# Patient Record
Sex: Male | Born: 1995 | Race: Black or African American | Hispanic: No | Marital: Single | Smoking: Never smoker
Health system: Southern US, Community
[De-identification: ages and names within clinical notes are randomized; demographics above are authoritative.]

## PROBLEM LIST (undated history)

## (undated) HISTORY — PX: CHOLECYSTECTOMY: SHX55

---
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
## 2018-02-23 ENCOUNTER — Encounter (HOSPITAL_COMMUNITY): Payer: Self-pay

## 2018-02-23 ENCOUNTER — Ambulatory Visit (HOSPITAL_COMMUNITY)
Admission: EM | Admit: 2018-02-23 | Discharge: 2018-02-23 | Disposition: A | Discharge status: Home / Self Care | Payer: Medicaid Other | Attending: Internal Medicine | Admitting: Internal Medicine

## 2018-02-23 DIAGNOSIS — Z113 Encounter for screening for infections with a predominantly sexual mode of transmission: Secondary | ICD-10-CM | POA: Diagnosis not present

## 2018-02-23 DIAGNOSIS — Z9049 Acquired absence of other specified parts of digestive tract: Secondary | ICD-10-CM | POA: Insufficient documentation

## 2018-02-23 DIAGNOSIS — R300 Dysuria: Secondary | ICD-10-CM | POA: Diagnosis present

## 2018-02-23 DIAGNOSIS — Z202 Contact with and (suspected) exposure to infections with a predominantly sexual mode of transmission: Secondary | ICD-10-CM | POA: Insufficient documentation

## 2018-02-23 DIAGNOSIS — Z7251 High risk heterosexual behavior: Secondary | ICD-10-CM

## 2018-02-23 LAB — POCT URINALYSIS DIP (DEVICE)
BILIRUBIN URINE: NEGATIVE
Glucose, UA: NEGATIVE mg/dL
HGB URINE DIPSTICK: NEGATIVE
Ketones, ur: NEGATIVE mg/dL
LEUKOCYTES UA: NEGATIVE
Nitrite: NEGATIVE
Protein, ur: 30 mg/dL — AB
SPECIFIC GRAVITY, URINE: 1.025 (ref 1.005–1.030)
Urobilinogen, UA: 0.2 mg/dL (ref 0.0–1.0)
pH: 6.5 (ref 5.0–8.0)

## 2018-02-23 MED ORDER — AZITHROMYCIN 250 MG PO TABS
ORAL_TABLET | ORAL | Status: AC
  Filled 2018-02-23: qty 4

## 2018-02-23 MED ORDER — AZITHROMYCIN 250 MG PO TABS
1000.0000 mg | ORAL_TABLET | Freq: Once | ORAL | Status: AC
  Administered 2018-02-23: 1000 mg via ORAL

## 2018-02-23 MED ORDER — CEFTRIAXONE SODIUM 250 MG IJ SOLR
250.0000 mg | Freq: Once | INTRAMUSCULAR | Status: AC
  Administered 2018-02-23: 250 mg via INTRAMUSCULAR

## 2018-02-23 MED ORDER — CEFTRIAXONE SODIUM 250 MG IJ SOLR
INTRAMUSCULAR | Status: AC
  Filled 2018-02-23: qty 250

## 2018-02-23 NOTE — ED Triage Notes (Signed)
Pt presents for STD testing after exposure

## 2018-02-23 NOTE — ED Provider Notes (Signed)
[facility]   [id number] 02/23/18 Arrival Time: 1756   SUBJECTIVE:  Johnny Rowe is a 22 y.o. male who presents with complaints of intermittent dysuria x 1 week.  Last unprotected sex 2 weeks ago.  Patient is sexually active with 2 male partners.  Partner reports having symptoms and diagnosed with chlamydia.  Denies aggravating or alleviating factors. Denies similar symptoms in the past.  Denies fever, chills, nausea, vomiting, abdominal pain, penile pain, penile itching, testicular swelling, testicular pain, penile rashes or lesions.   No LMP for male patient.  ROS: As per HPI.  History reviewed. No pertinent past medical history. Past Surgical History:  Procedure Laterality Date  . CHOLECYSTECTOMY     Not on File No current facility-administered medications on file prior to encounter.    No current outpatient medications on file prior to encounter.    Social History   Socioeconomic History  . Marital status: Single    Spouse name: Not on file  . Number of children: Not on file  . Years of education: Not on file  . Highest education level: Not on file  Occupational History  . Not on file  Social Needs  . Financial resource strain: Not on file  . Food insecurity:    Worry: Not on file    Inability: Not on file  . Transportation needs:    Medical: Not on file    Non-medical: Not on file  Tobacco Use  . Smoking status: Never Smoker  Substance and Sexual Activity  . Alcohol use: Yes  . Drug use: Not Currently  . Sexual activity: Not on file  Lifestyle  . Physical activity:    Days per week: Not on file    Minutes per session: Not on file  . Stress: Not on file  Relationships  . Social connections:    Talks on phone: Not on file    Gets together: Not on file    Attends religious service: Not on file    Active member of club or organization: Not on file    Attends meetings of clubs or organizations: Not on file    Relationship status: Not on file  .  Intimate partner violence:    Fear of current or ex partner: Not on file    Emotionally abused: Not on file    Physically abused: Not on file    Forced sexual activity: Not on file  Other Topics Concern  . Not on file  Social History Narrative  . Not on file   Family History  Problem Relation Age of Onset  . Healthy Mother   . Healthy Father     OBJECTIVE:  Vitals:   02/23/18 1809  BP: 131/73  Pulse: 70  Resp: 16  SpO2: 100%     General appearance: alert, cooperative, appears stated age and no distress Throat: lips, mucosa, and tongue normal; teeth and gums normal Lungs: CTA bilaterally without adventitious breath sounds Heart: regular rate and rhythm.  Radial pulses 2+ symmetrical bilaterally Back: no CVA tenderness Abdomen: soft, non-tender; bowel sounds normal; no masses or organomegaly; no guarding or rebound tenderness GU: declines Skin: warm and dry Psychological:  Alert and cooperative. Normal mood and affect.  Results for orders placed or performed during the hospital encounter of 02/23/18  POCT urinalysis dip (device)  Result Value Ref Range   Glucose, UA NEGATIVE NEGATIVE mg/dL   Bilirubin Urine NEGATIVE NEGATIVE   Ketones, ur NEGATIVE NEGATIVE mg/dL   Specific Gravity, Urine  1.025 1.005 - 1.030   Hgb urine dipstick NEGATIVE NEGATIVE   pH 6.5 5.0 - 8.0   Protein, ur 30 (A) NEGATIVE mg/dL   Urobilinogen, UA 0.2 0.0 - 1.0 mg/dL   Nitrite NEGATIVE NEGATIVE   Leukocytes, UA NEGATIVE NEGATIVE    Labs Reviewed  POCT URINALYSIS DIP (DEVICE) - Abnormal; Notable for the following components:      Result Value   Protein, ur 30 (*)    All other components within normal limits  HIV ANTIBODY (ROUTINE TESTING)  [facility]  URINE CYTOLOGY ANCILLARY ONLY    ASSESSMENT & PLAN:  1. Dysuria   2. STD exposure   3. Unprotected sex     Meds ordered this encounter  Medications  . azithromycin (ZITHROMAX) tablet 1,000 mg  . cefTRIAXone (ROCEPHIN) injection 250 mg     Pending: Labs Reviewed  POCT URINALYSIS DIP (DEVICE) - Abnormal; Notable for the following components:      Result Value   Protein, ur 30 (*)    All other components within normal limits  HIV ANTIBODY (ROUTINE TESTING)  [facility]  URINE CYTOLOGY ANCILLARY ONLY   Urine did show protein in your urine.  This could be due to dehydration.  Please increase your water intake and follow up with PCP or community health in 1-2 weeks to have urine rechecked.   Given rocephin 250mg  injection and azithromycin 1g in office Urine cytology obtained. HIV/ syphilis testing today We will follow up with you regarding the results of your test If tests are positive, please abstain from sexual activity for at least 7 days and notify partners Follow up with PCP or with [facility] if symptoms persists Return here or go to ER if you have any new or worsening symptoms    Reviewed expectations re: course of current medical issues. Questions answered. Outlined signs and symptoms indicating need for more acute intervention. Patient verbalized understanding. After Visit Summary given.       Rennis Harding, PA-C 02/23/18 2114

## 2018-02-23 NOTE — Discharge Instructions (Addendum)
Urine did show protein in your urine.  This could be due to dehydration.  Please increase your water intake and follow up with PCP in 1-2 weeks to have urine rechecked.   Given rocephin 250mg  injection and azithromycin 1g in office Urine cytology obtained. HIV/ syphilis testing today We will follow up with you regarding the results of your test If tests are positive, please abstain from sexual activity for at least 7 days and notify partners Return, [facility] with PCP[facility] if symptoms persists Return here or go to ER if you have any new or worsening symptoms

## 2018-02-24 LAB — RPR: RPR Ser Ql: NONREACTIVE

## 2018-02-24 LAB — HIV ANTIBODY (ROUTINE TESTING W REFLEX): HIV Screen 4th Generation wRfx: NONREACTIVE

## 2018-02-25 ENCOUNTER — Telehealth (HOSPITAL_COMMUNITY): Payer: Self-pay

## 2018-02-25 LAB — URINE CYTOLOGY ANCILLARY ONLY
Chlamydia: POSITIVE — AB
NEISSERIA GONORRHEA: NEGATIVE
Trichomonas: NEGATIVE

## 2018-02-25 NOTE — Telephone Encounter (Signed)
Chlamydia is positive.  This was treated at the urgent care visit with po zithromax 1g.  Pt contacted and made aware of results. Educated pt to please refrain from sexual intercourse for 7 days to give the medicine time to work.  Sexual partners need to be notified and tested/treated.  Condoms may reduce risk of reinfection.  Recheck or followup with PCP for further evaluation if symptoms are not improving.  [facility] notified 

## 2018-02-27 LAB — URINE CYTOLOGY ANCILLARY ONLY: CANDIDA VAGINITIS: NEGATIVE
